# Patient Record
Sex: Female | Born: 1950 | Race: White | Hispanic: No | Marital: Married | Smoking: Former smoker
Health system: Southern US, Community
[De-identification: ages and names within clinical notes are randomized; demographics above are authoritative.]

## PROBLEM LIST (undated history)

## (undated) DIAGNOSIS — I10 Essential (primary) hypertension: Secondary | ICD-10-CM

## (undated) DIAGNOSIS — Z973 Presence of spectacles and contact lenses: Secondary | ICD-10-CM

## (undated) DIAGNOSIS — I499 Cardiac arrhythmia, unspecified: Secondary | ICD-10-CM

## (undated) DIAGNOSIS — T7840XA Allergy, unspecified, initial encounter: Secondary | ICD-10-CM

## (undated) DIAGNOSIS — M549 Dorsalgia, unspecified: Secondary | ICD-10-CM

## (undated) DIAGNOSIS — K219 Gastro-esophageal reflux disease without esophagitis: Secondary | ICD-10-CM

## (undated) DIAGNOSIS — M1990 Unspecified osteoarthritis, unspecified site: Secondary | ICD-10-CM

## (undated) HISTORY — DX: Gastro-esophageal reflux disease without esophagitis: K21.9

## (undated) HISTORY — PX: COLONOSCOPY: SHX174

## (undated) HISTORY — DX: Allergy, unspecified, initial encounter: T78.40XA

## (undated) HISTORY — PX: CHOLECYSTECTOMY: SHX55

## (undated) HISTORY — PX: OTHER SURGICAL HISTORY: SHX169

## (undated) HISTORY — PX: FRACTURE SURGERY: SHX138

---

## 1969-08-21 HISTORY — PX: THYROIDECTOMY, PARTIAL: SHX18

## 1992-08-21 HISTORY — PX: ELBOW SURGERY: SHX618

## 1999-10-06 ENCOUNTER — Other Ambulatory Visit: Admission: RE | Admit: 1999-10-06 | Discharge: 1999-10-06 | Payer: Self-pay | Admitting: Obstetrics and Gynecology

## 1999-10-25 ENCOUNTER — Encounter: Payer: Self-pay | Admitting: Surgery

## 1999-10-27 ENCOUNTER — Encounter (INDEPENDENT_AMBULATORY_CARE_PROVIDER_SITE_OTHER): Payer: Self-pay | Admitting: Specialist

## 1999-10-27 ENCOUNTER — Observation Stay (HOSPITAL_COMMUNITY): Admission: RE | Admit: 1999-10-27 | Discharge: 1999-10-28 | Payer: Self-pay | Admitting: Surgery

## 2002-07-16 ENCOUNTER — Encounter: Payer: Self-pay | Admitting: Internal Medicine

## 2002-07-16 ENCOUNTER — Encounter: Admission: RE | Admit: 2002-07-16 | Discharge: 2002-07-16 | Payer: Self-pay | Admitting: Internal Medicine

## 2002-12-26 ENCOUNTER — Other Ambulatory Visit: Admission: RE | Admit: 2002-12-26 | Discharge: 2002-12-26 | Payer: Self-pay | Admitting: Obstetrics and Gynecology

## 2004-01-22 ENCOUNTER — Other Ambulatory Visit: Admission: RE | Admit: 2004-01-22 | Discharge: 2004-01-22 | Payer: Self-pay | Admitting: Obstetrics and Gynecology

## 2005-03-15 ENCOUNTER — Other Ambulatory Visit: Admission: RE | Admit: 2005-03-15 | Discharge: 2005-03-15 | Payer: Self-pay | Admitting: Obstetrics and Gynecology

## 2005-04-25 ENCOUNTER — Encounter: Admission: RE | Admit: 2005-04-25 | Discharge: 2005-04-25 | Payer: Self-pay | Admitting: *Deleted

## 2006-04-13 ENCOUNTER — Ambulatory Visit: Payer: Self-pay | Admitting: Internal Medicine

## 2006-04-17 ENCOUNTER — Ambulatory Visit: Payer: Self-pay | Admitting: Internal Medicine

## 2006-05-25 ENCOUNTER — Encounter: Admission: RE | Admit: 2006-05-25 | Discharge: 2006-05-25 | Payer: Self-pay | Admitting: Neurology

## 2009-09-10 ENCOUNTER — Ambulatory Visit (HOSPITAL_COMMUNITY): Admission: RE | Admit: 2009-09-10 | Discharge: 2009-09-10 | Payer: Self-pay | Admitting: Cardiology

## 2009-09-14 ENCOUNTER — Ambulatory Visit (HOSPITAL_COMMUNITY): Admission: RE | Admit: 2009-09-14 | Discharge: 2009-09-14 | Payer: Self-pay | Admitting: Cardiology

## 2010-02-03 IMAGING — CR DG CHEST 2V
2 series · 2 of 2 positions shown · non-contrast
Comparison: None

CLINICAL DATA: Arrhythmia, preoperative assessment

CHEST - 2 VIEW

[view not recorded (1 of 2)]
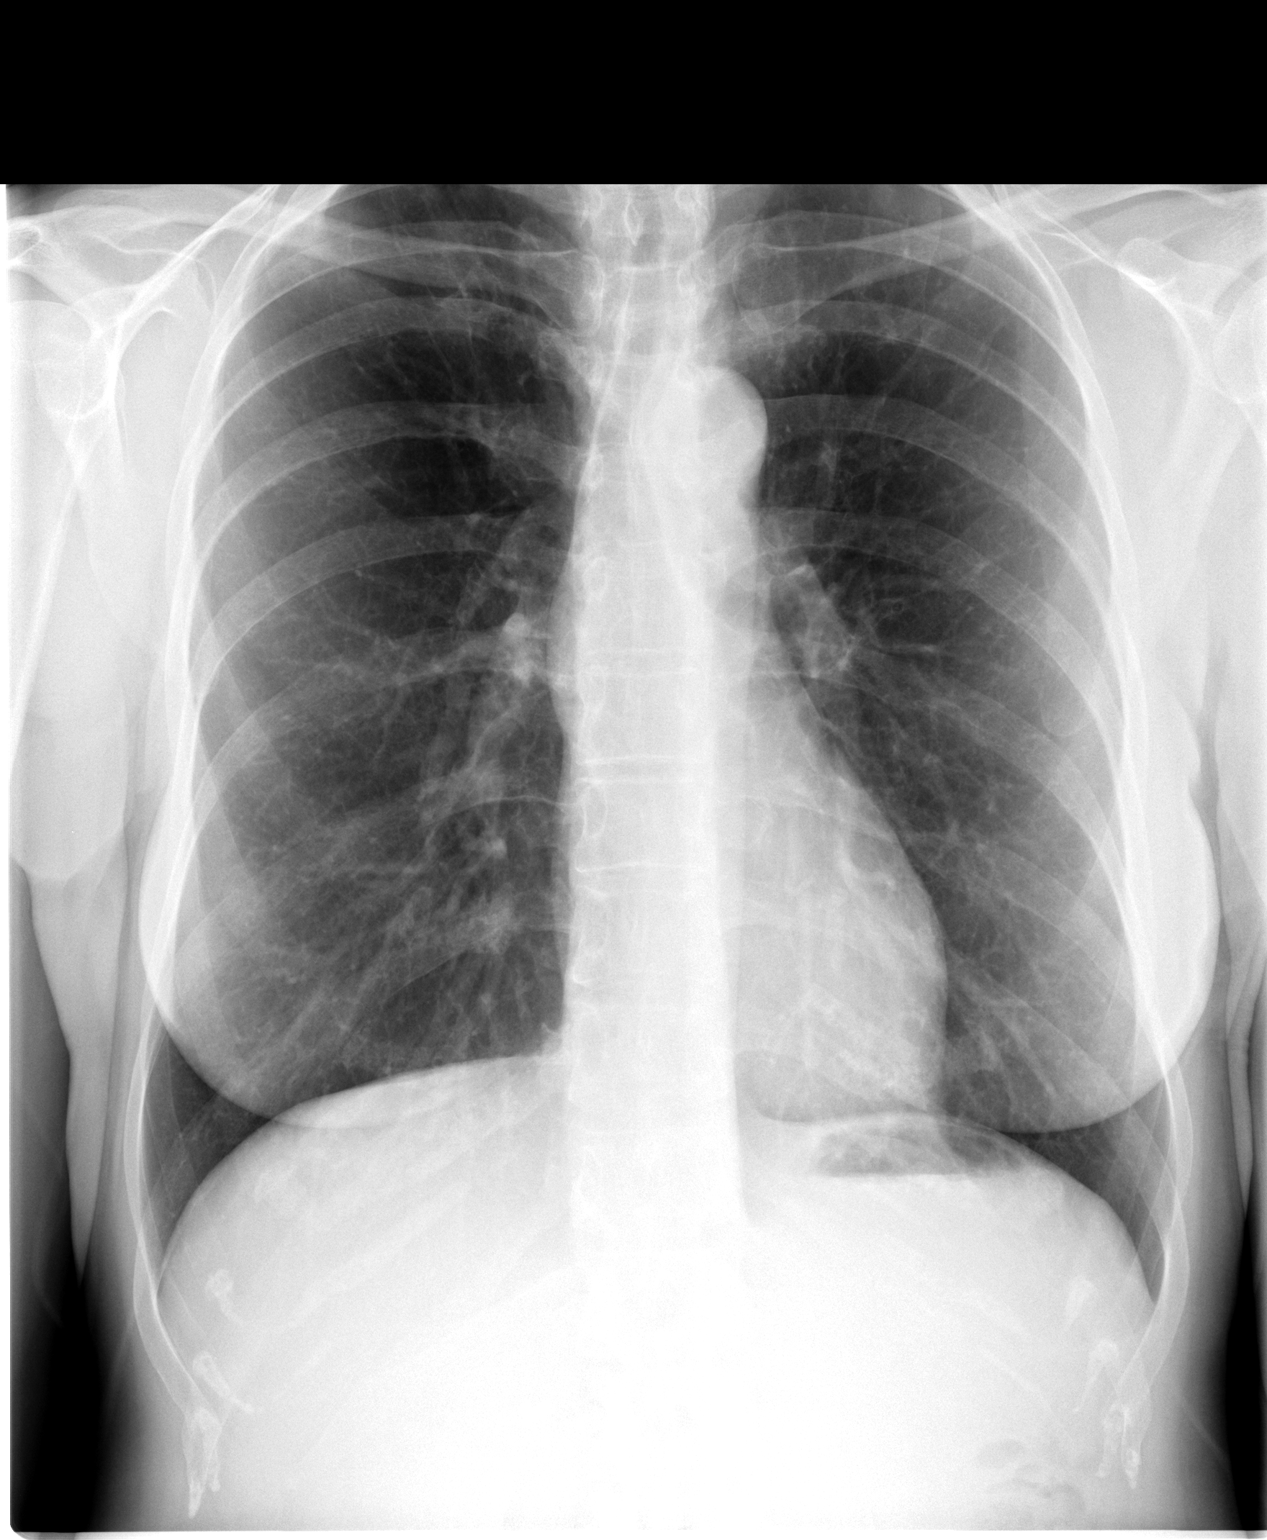

[view not recorded (2 of 2)]
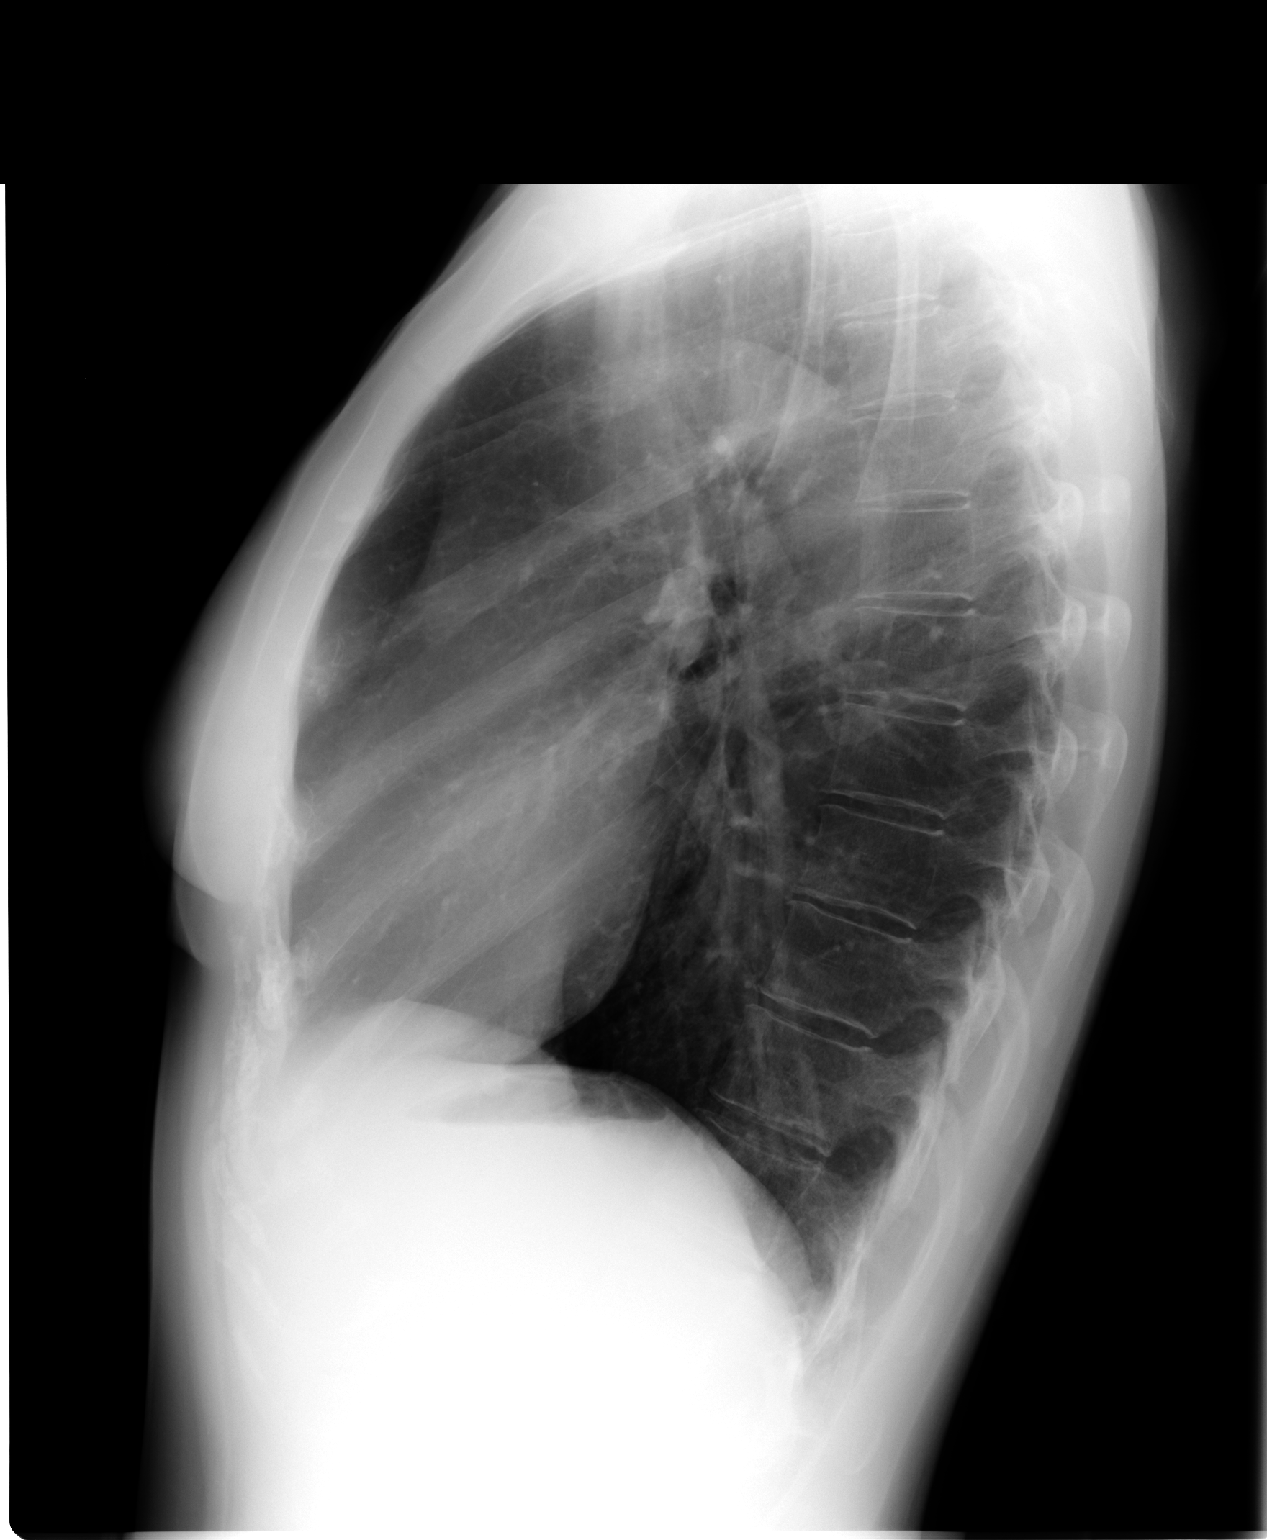

[2 of 2 positions shown; findings below may reference images not displayed]

FINDINGS: Normal heart size, mediastinal contours, pulmonary vascularity.
Emphysematous changes with biapical calcification and scarring.
No acute infiltrate, pleural effusion, or pneumothorax.
No acute bony findings.
IMPRESSION: Emphysematous lungs question COPD with biapical scarring.
No acute abnormalities.

## 2011-01-06 NOTE — Procedures (Signed)
[facility]                            ABDOMINAL ULTRASOUND REPORT   NAME:[name] [name] [name]                        MRN:          [id number]  DATE:04/17/2006                            DOB:          08/11/1951    ACCESSION NUMBER:  [id number].   READING PHYSICIAN:  Hedwig Morton. Juanda Chance, MD   INDICATIONS FOR PROCEDURE:  Abdominal pain.   PROCEDURE:  Multiplanar abdominal ultrasound imaging was performed in the  upright, supine, right and left lateral decubitus positions.   RESULTS:  ABDOMINAL AORTA:  Abdominal aorta is 25 mm.  The abdominal aorta  appears normal.  The IVC is patent.  PANCREAS:  The pancreas appears normal throughout the head, body and tail  without evidence of ductal dilatation, pancreatic masses or peripancreatic  inflammation.  GALLBLADDER:  Gallbladder is well distended, thin walled, with no  pericholecystic fluid or intraluminal echogenic foci to suggest gallstone  disease. Status post cholecystectomy.  COMMON BILE DUCT:  The common bile duct measures 5.7 mm in maximal diameter  and appears normal without evidence of intraluminal foci.  LIVER:  The liver appears normal without evidence of parenchymal lesion,  ductal dilatation or vascular abnormality.  KIDNEYS:  Kidneys measure 105 mm right, 70 mm left.  Both kidneys are normal  in appearance.  SPLEEN:  The spleen is normal in size, measuring 100 mm without parenchymal  lesion.   IMPRESSION:  Normal post cholecystectomy ultrasound of the right upper  quadrant.                                   Hedwig Morton. Juanda Chance, MD   DMB/MedQ  DD:  04/24/2006  DT:  04/25/2006  Job #:  [id number]

## 2011-01-06 NOTE — Assessment & Plan Note (Signed)
[facility]                           GASTROENTEROLOGY OFFICE NOTE   [name] [name]                        MRN:          [id number]  DATE:04/13/2006                            DOB:          1950-09-24    HISTORY OF PRESENT ILLNESS:  [name] [name] is a 60 year old white female. She is a  Runner, broadcasting/film/video at [employer]. We have seen her in the past for  colorectal screening and also for gastroesophageal reflux and epigastric  pain. She had an episode of severe abdominal pain, nausea, vomiting, and  diarrhea in July, which lasted for about a week. It radiated to the scapula  and to the back. [name] [name] had a biliary dysfunction in 2000 and underwent  cholecystectomy. Based on abnormal HIDA scan, she did not have  cholelithiasis. Upper endoscopy in 1993 showed gastritis. Her CLO test was  negative. There was no fever or jaundice. She was evaluated by Dr. Dimas Aguas  who evaluated the patient with a blood test and found that she has mildly  elevated serum amylase of 142, normal 25 to 125. Her liver function studies  were normal and her white blood cell count was normal at 5,200. [name] [name] has  been chronically on Coumadin because of chronic atrial fibrillation. She is  followed by Dr. Lenise Herald. She has actually remained in sinus rhythm  for the past 6 months.   MEDICATIONS:  1. Coumadin alternating 5 and 2.5 mg.  2. Rythmol SR 225 mg b.i.d.  3. Flonase 1 spray daily.  4. Provera 2.5 mg daily.  5. Calcium 600 mg b.i.d.  6. Vitamin D 400 mg b.i.d.  7. Centrum Silver.  8. Fish Oil.  9. Fiber caplets.  10.Prilosec 20 mg b.i.d.  11.Zantac 150 mg p.o. daily.  12.Estratest.   PHYSICAL EXAMINATION:  VITAL SIGNS:  Blood pressure 114/80, pulse 48, weight  146 pounds.  GENERAL:  She was in no distress.  LUNGS:  Clear to auscultation.  HEART:  Normal S1 and S2.  ABDOMEN:  Soft. Minimally tender in epigastrium and right lower quadrant  along the right costal  margin. Lower abdomen was normal.  RECTAL:  Examination shows excess Hemoccult negative stool.   IMPRESSION:  2. A 60 year old white female with acute episode of abdominal pain,      possible biliary in origin, rule out sphincter of Oddi dysfunction,      rule out retained common bile stone, rule out low grade pancreatitis.      History of erosive gastritis.  2. Chronic constipation.   PLAN:  1. Repeat amylase, lipase, and liver function tests.  2. Continue Proton pump inhibitor.  3. Schedule for abdominal ultrasound. Depending on the results of the      blood tests and the ultrasound, she may need ERCP versus upper      endoscopy.                                   [name] [name]. [name] Chance, MD   DMB/MedQ  DD:  04/13/2006  DT:  04/14/2006  Job #:  [id number]   cc:   [name] [name]

## 2011-10-05 ENCOUNTER — Encounter: Payer: Self-pay | Admitting: Internal Medicine

## 2012-05-14 ENCOUNTER — Other Ambulatory Visit: Payer: Self-pay | Admitting: Orthopedic Surgery

## 2012-05-17 ENCOUNTER — Encounter (HOSPITAL_BASED_OUTPATIENT_CLINIC_OR_DEPARTMENT_OTHER): Payer: Self-pay | Admitting: *Deleted

## 2012-05-17 NOTE — Progress Notes (Signed)
Dr singer cleared cardiac notes for pt to have surgery

## 2012-05-17 NOTE — Progress Notes (Signed)
Pt lives in [facility], will come early Tuesday for 1stat.  Has hx of at fib and svt, no occurrence in 2 and half years on no meds.  Called for ekg done in las few weeks at medical doctor.  Pt has new pt visit with cardiologist oct 18 in [place].  Called [place] cards for last note and ekg.

## 2012-05-20 NOTE — H&P (Signed)
  [name] [name] is an 61 y.o. female.   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 She also has a history of bilateral wrist volar myxoid cysts, left greater than right. She has a strong family history with her mother experiencing similar volar cysts in her later life.   [name] has had bilateral hand numbness, night greater than day, aggravated by her active lifestyle which includes golf, tennis, and yoga. She has a history of chronic atrial fibrillation and is being worked up for her atrial fibrillation at this time. She has undergone several injections which have provided her with only temporary relief. She wishes to proceed with surgery at this time.  Past Medical History  Diagnosis Date  . Hypertension   . Dysrhythmia     at fib  svt  no med and no prolbems for 2 and  half years      Past Surgical History  Procedure Date  . Fracture surgery     3 surgeries on foot as child  . Cholecystectomy     2000  . Loop inplant and removed for at fib and svt     No family history on file. Social History:  reports that she has never smoked. She does not have any smokeless tobacco history on file. She reports that she does not use illicit drugs. Her alcohol history not on file.  Allergies: Not on File  No prescriptions prior to admission    No results found for this or any previous visit (from the past 48 hour(s)).  No results found.   Pertinent items are noted in HPI.  There were no vitals taken for this visit.  General appearance: alert Head: Normocephalic, without obvious abnormality Neck: supple, symmetrical, trachea midline Resp: clear to auscultation bilaterally Cardio: regular rate and rhythm GI: WNL Extremities:Inspection of her hands reveals multi-lobular volar ganglions adjacent to the flexor carpi  radialis tendon left more prominent than the right. She has full ROM of her wrist and fingers. She has a positive wrist flexion test at 1 minute on the left. She has a positive Tinel's sign on the right, negative on the left. She has no sign of STS of her fingers, thumb or first dorsal compartment. Her pulses and capillary refill are intact. There is no muscle atrophy.  Plain x-rays of her wrist demonstrate bilateral STT arthrosis and marginal osteophyte formation Patient was seen by Dr. Johna Roles for bilateral electrodiagnostic studies of her median and ulnar nerves.  She has significant bilateral carpal tunnel syndrome, left slightly worse than right  Pulses: 2+ and symmetric Skin: normal Neurologic: Grossly normal    Assessment/Plan Impression:Right CTS  Plan:To the OR for Right CTR.The procedure, risks,benefits and post-op course were discussed with the patient at length and they were in agreement with the plan.   DASNOIT,[name] [name] 05/20/2012, 1:29 PM     H&P documentation: 05/21/2012  -History and Physical Reviewed  -Patient has been re-examined  -No change in the plan of care  [name] Forster, MD

## 2012-05-21 ENCOUNTER — Encounter (HOSPITAL_BASED_OUTPATIENT_CLINIC_OR_DEPARTMENT_OTHER)
Admission: RE | Disposition: A | Discharge status: Home / Self Care | Payer: Self-pay | Source: Ambulatory Visit | Attending: Orthopedic Surgery

## 2012-05-21 ENCOUNTER — Encounter (HOSPITAL_BASED_OUTPATIENT_CLINIC_OR_DEPARTMENT_OTHER): Payer: Self-pay | Admitting: Certified Registered"

## 2012-05-21 ENCOUNTER — Ambulatory Visit (HOSPITAL_BASED_OUTPATIENT_CLINIC_OR_DEPARTMENT_OTHER): Payer: Managed Care, Other (non HMO) | Admitting: Certified Registered"

## 2012-05-21 ENCOUNTER — Ambulatory Visit (HOSPITAL_BASED_OUTPATIENT_CLINIC_OR_DEPARTMENT_OTHER)
Admission: RE | Admit: 2012-05-21 | Discharge: 2012-05-21 | Disposition: A | Discharge status: Home / Self Care | Payer: Managed Care, Other (non HMO) | Source: Ambulatory Visit | Attending: Orthopedic Surgery | Admitting: Orthopedic Surgery

## 2012-05-21 ENCOUNTER — Encounter (HOSPITAL_BASED_OUTPATIENT_CLINIC_OR_DEPARTMENT_OTHER): Payer: Self-pay

## 2012-05-21 DIAGNOSIS — G5600 Carpal tunnel syndrome, unspecified upper limb: Secondary | ICD-10-CM | POA: Insufficient documentation

## 2012-05-21 DIAGNOSIS — I10 Essential (primary) hypertension: Secondary | ICD-10-CM | POA: Insufficient documentation

## 2012-05-21 HISTORY — DX: Cardiac arrhythmia, unspecified: I49.9

## 2012-05-21 HISTORY — PX: CARPAL TUNNEL RELEASE: SHX101

## 2012-05-21 HISTORY — DX: Essential (primary) hypertension: I10

## 2012-05-21 LAB — POCT I-STAT, CHEM 8
Calcium, Ion: 1.18 mmol/L (ref 1.13–1.30)
Creatinine, Ser: 1.1 mg/dL (ref 0.50–1.10)
Glucose, Bld: 87 mg/dL (ref 70–99)
HCT: 40 % (ref 36.0–46.0)
Hemoglobin: 13.6 g/dL (ref 12.0–15.0)
Potassium: 3.4 mEq/L — ABNORMAL LOW (ref 3.5–5.1)
TCO2: 24 mmol/L (ref 0–100)

## 2012-05-21 SURGERY — CARPAL TUNNEL RELEASE
Anesthesia: General | Site: Wrist | Laterality: Right | Wound class: Clean

## 2012-05-21 MED ORDER — MIDAZOLAM HCL 5 MG/5ML IJ SOLN
INTRAMUSCULAR | Status: DC | PRN
  Administered 2012-05-21: 2 mg via INTRAVENOUS

## 2012-05-21 MED ORDER — OXYCODONE HCL 5 MG/5ML PO SOLN: 5.0000 mg | Freq: Once | ORAL | Status: DC | PRN

## 2012-05-21 MED ORDER — HYDROMORPHONE HCL PF 1 MG/ML IJ SOLN
0.2500 mg | INTRAMUSCULAR | Status: DC | PRN
  Administered 2012-05-21: 0.5 mg via INTRAVENOUS

## 2012-05-21 MED ORDER — CHLORHEXIDINE GLUCONATE 4 % EX LIQD: 60.0000 mL | Freq: Once | CUTANEOUS | Status: DC

## 2012-05-21 MED ORDER — OXYCODONE HCL 5 MG PO TABS: 5.0000 mg | ORAL_TABLET | Freq: Once | ORAL | Status: DC | PRN

## 2012-05-21 MED ORDER — MEPERIDINE HCL 25 MG/ML IJ SOLN: 6.2500 mg | INTRAMUSCULAR | Status: DC | PRN

## 2012-05-21 MED ORDER — ONDANSETRON HCL 4 MG/2ML IJ SOLN
INTRAMUSCULAR | Status: DC | PRN
  Administered 2012-05-21: 4 mg via INTRAVENOUS

## 2012-05-21 MED ORDER — DEXAMETHASONE SODIUM PHOSPHATE 4 MG/ML IJ SOLN
INTRAMUSCULAR | Status: DC | PRN
  Administered 2012-05-21: 8 mg via INTRAVENOUS

## 2012-05-21 MED ORDER — LIDOCAINE HCL 2 % IJ SOLN
INTRAMUSCULAR | Status: DC | PRN
  Administered 2012-05-21: 4 mL

## 2012-05-21 MED ORDER — ONDANSETRON HCL 4 MG/2ML IJ SOLN: 4.0000 mg | Freq: Once | INTRAMUSCULAR | Status: DC | PRN

## 2012-05-21 MED ORDER — HYDROCODONE-ACETAMINOPHEN 5-325 MG PO TABS: ORAL_TABLET | ORAL | Status: DC

## 2012-05-21 MED ORDER — LIDOCAINE HCL (CARDIAC) 20 MG/ML IV SOLN
INTRAVENOUS | Status: DC | PRN
  Administered 2012-05-21: 100 mg via INTRAVENOUS

## 2012-05-21 MED ORDER — LACTATED RINGERS IV SOLN
INTRAVENOUS | Status: DC
  Administered 2012-05-21: 07:00:00 via INTRAVENOUS

## 2012-05-21 MED ORDER — PROPOFOL 10 MG/ML IV BOLUS
INTRAVENOUS | Status: DC | PRN
  Administered 2012-05-21: 150 mg via INTRAVENOUS

## 2012-05-21 MED ORDER — FENTANYL CITRATE 0.05 MG/ML IJ SOLN
INTRAMUSCULAR | Status: DC | PRN
  Administered 2012-05-21: 100 ug via INTRAVENOUS

## 2012-05-21 SURGICAL SUPPLY — 37 items
BANDAGE ADHESIVE 1X3 (GAUZE/BANDAGES/DRESSINGS) IMPLANT
BANDAGE ELASTIC 3 VELCRO ST LF (GAUZE/BANDAGES/DRESSINGS) ×2 IMPLANT
BLADE SURG 15 STRL LF DISP TIS (BLADE) ×1 IMPLANT
BLADE SURG 15 STRL SS (BLADE) ×2
BNDG CMPR 9X4 STRL LF SNTH (GAUZE/BANDAGES/DRESSINGS) ×1
BNDG ESMARK 4X9 LF (GAUZE/BANDAGES/DRESSINGS) ×1 IMPLANT
BRUSH SCRUB EZ PLAIN DRY (MISCELLANEOUS) ×2 IMPLANT
CLOTH BEACON ORANGE TIMEOUT ST (SAFETY) ×2 IMPLANT
CORDS BIPOLAR (ELECTRODE) IMPLANT
COVER MAYO STAND STRL (DRAPES) ×2 IMPLANT
COVER TABLE BACK 60X90 (DRAPES) ×2 IMPLANT
CUFF TOURNIQUET SINGLE 18IN (TOURNIQUET CUFF) ×1 IMPLANT
DECANTER SPIKE VIAL GLASS SM (MISCELLANEOUS) IMPLANT
DRAPE EXTREMITY T 121X128X90 (DRAPE) ×2 IMPLANT
DRAPE SURG 17X23 STRL (DRAPES) ×2 IMPLANT
GLOVE BIO SURGEON STRL SZ7 (GLOVE) ×1 IMPLANT
GLOVE BIOGEL M STRL SZ7.5 (GLOVE) ×2 IMPLANT
GLOVE ORTHO TXT STRL SZ7.5 (GLOVE) ×2 IMPLANT
GOWN PREVENTION PLUS XLARGE (GOWN DISPOSABLE) ×2 IMPLANT
GOWN PREVENTION PLUS XXLARGE (GOWN DISPOSABLE) ×4 IMPLANT
NEEDLE 27GAX1X1/2 (NEEDLE) ×1 IMPLANT
PACK BASIN DAY SURGERY FS (CUSTOM PROCEDURE TRAY) ×2 IMPLANT
PAD CAST 3X4 CTTN HI CHSV (CAST SUPPLIES) ×1 IMPLANT
PADDING CAST ABS 4INX4YD NS (CAST SUPPLIES) ×1
PADDING CAST ABS COTTON 4X4 ST (CAST SUPPLIES) ×1 IMPLANT
PADDING CAST COTTON 3X4 STRL (CAST SUPPLIES) ×2
SPLINT PLASTER CAST XFAST 3X15 (CAST SUPPLIES) ×5 IMPLANT
SPLINT PLASTER XTRA FASTSET 3X (CAST SUPPLIES) ×5
SPONGE GAUZE 4X4 12PLY (GAUZE/BANDAGES/DRESSINGS) ×2 IMPLANT
STOCKINETTE 4X48 STRL (DRAPES) ×2 IMPLANT
STRIP CLOSURE SKIN 1/2X4 (GAUZE/BANDAGES/DRESSINGS) ×2 IMPLANT
SUT PROLENE 3 0 PS 2 (SUTURE) ×2 IMPLANT
SYR 3ML 23GX1 SAFETY (SYRINGE) IMPLANT
SYR CONTROL 10ML LL (SYRINGE) ×1 IMPLANT
TRAY DSU PREP LF (CUSTOM PROCEDURE TRAY) ×2 IMPLANT
UNDERPAD 30X30 INCONTINENT (UNDERPADS AND DIAPERS) ×2 IMPLANT
WATER STERILE IRR 1000ML POUR (IV SOLUTION) ×2 IMPLANT

## 2012-05-21 NOTE — Transfer of Care (Signed)
Immediate Anesthesia Transfer of Care Note  Patient: [name] [name]  Procedure(s) Performed: Procedure(s) (LRB) with comments: CARPAL TUNNEL RELEASE (Right) - CARPAL TUNNEL RELEASE  Patient Location: PACU  Anesthesia Type: General  Level of Consciousness: sedated and unresponsive  Airway & Oxygen Therapy: Patient Spontanous Breathing and Patient connected to face mask oxygen  Post-op Assessment: Report given to PACU RN and Post -op Vital signs reviewed and stable  Post vital signs: Reviewed and stable  Complications: No apparent anesthesia complications

## 2012-05-21 NOTE — Anesthesia Preprocedure Evaluation (Signed)
Anesthesia Evaluation  Patient identified by MRN, date of birth, ID band Patient awake    Reviewed: Allergy & Precautions, H&P , NPO status , Patient's Chart, lab work & pertinent test results  Airway Mallampati: I TM Distance: >3 FB Neck ROM: Full    Dental   Pulmonary          Cardiovascular hypertension, + dysrhythmias Atrial Fibrillation     Neuro/Psych    GI/Hepatic   Endo/Other    Renal/GU      Musculoskeletal   Abdominal   Peds  Hematology   Anesthesia Other Findings   Reproductive/Obstetrics                           Anesthesia Physical Anesthesia Plan  ASA: III  Anesthesia Plan: General   Post-op Pain Management:    Induction: Intravenous  Airway Management Planned: LMA  Additional Equipment:   Intra-op Plan:   Post-operative Plan: Extubation in OR  Informed Consent: I have reviewed the patients History and Physical, chart, labs and discussed the procedure including the risks, benefits and alternatives for the proposed anesthesia with the patient or authorized representative who has indicated his/her understanding and acceptance.     Plan Discussed with: CRNA and Surgeon  Anesthesia Plan Comments:         Anesthesia Quick Evaluation

## 2012-05-21 NOTE — Op Note (Signed)
[id number]  

## 2012-05-21 NOTE — Anesthesia Postprocedure Evaluation (Signed)
Anesthesia Post Note  Patient: [name] [name]  Procedure(s) Performed: Procedure(s) (LRB): CARPAL TUNNEL RELEASE (Right)  Anesthesia type: general  Patient location: PACU  Post pain: Pain level controlled  Post assessment: Patient's Cardiovascular Status Stable  Last Vitals:  Filed Vitals:   05/21/12 0816  BP:   Pulse: 54  Temp:   Resp: 9    Post vital signs: Reviewed and stable  Level of consciousness: sedated  Complications: No apparent anesthesia complications

## 2012-05-21 NOTE — Brief Op Note (Signed)
05/21/2012  8:05 AM  PATIENT:  [name] [name]  61 y.o. female  PRE-OPERATIVE DIAGNOSIS:  RIGHT CARPAL TUNNEL SYNDROME  POST-OPERATIVE DIAGNOSIS:  RIGHT CARPAL TUNNEL SYNDROME  PROCEDURE:  Procedure(s) (LRB) with comments: CARPAL TUNNEL RELEASE (Right) - CARPAL TUNNEL RELEASE  SURGEON:  Surgeon(s) and Role:    * Wyn Forster., MD - Primary  PHYSICIAN ASSISTANT:   ASSISTANTS:Aiana Nordquist Dasnoit,P.A-C    ANESTHESIA:   general  EBL:  Total I/O In: 200 [I.V.:200] Out: -   BLOOD ADMINISTERED:none  DRAINS: none   LOCAL MEDICATIONS USED:  XYLOCAINE   SPECIMEN:  No Specimen  DISPOSITION OF SPECIMEN:  N/A  COUNTS:  YES  TOURNIQUET:  * Missing tourniquet times found for documented tourniquets in log:  [id number] *  DICTATION: .Other Dictation: Dictation Number [id number]  PLAN OF CARE: Discharge to home after PACU  PATIENT DISPOSITION:  PACU - hemodynamically stable.

## 2012-05-22 ENCOUNTER — Encounter (HOSPITAL_BASED_OUTPATIENT_CLINIC_OR_DEPARTMENT_OTHER): Payer: Self-pay | Admitting: Orthopedic Surgery

## 2012-05-22 NOTE — Op Note (Signed)
NAMEALLISA, [name]                 ACCOUNT NO.:  [id number]  MEDICAL RECORD NO.:  [id number]  LOCATION:                                 FACILITY:  PHYSICIAN:  Katy Fitch. Sai Moura, M.D.      DATE OF BIRTH:  DATE OF PROCEDURE:  05/21/2012 DATE OF DISCHARGE:                              OPERATIVE REPORT   PREOPERATIVE DIAGNOSIS:  Entrapment neuropathy, median nerve, right carpal tunnel.  POSTOPERATIVE DIAGNOSIS:  Entrapment neuropathy, median nerve, right carpal tunnel.  OPERATION:  Release of right transverse carpal ligament.  OPERATING SURGEON:  Katy Fitch. Maila Dukes, MD  ASSISTANT:  Marveen Reeks Dasnoit, PA-C  ANESTHESIA:  General by LMA.  SUPERVISING ANESTHESIOLOGIST:  Kaylyn Layer. 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 She has a history of bilateral hand numbness.  She presented for evaluation, and was noted to have evidence of clinical carpal tunnel syndrome.  Electrodiagnostic studies confirmed moderately severe carpal tunnel syndrome.  Due to a failure to respond to nonoperative measures, we have recommended proceeding with release of her right transverse carpal ligament at this time.  Preoperatively, questions were invited and answered in detail in the office as well in the holding area.  Her correct surgical site was marked per protocol in the holding area.  She was then transferred to room #6 of the [facility], where under Dr. Deirdre Priest direct supervision, general anesthesia by LMA technique was induced.  The right arm was prepped with Betadine soap and solution, sterilely draped.  A pneumatic tourniquet was applied to the proximal right brachium.  Following exsanguination of the right arm with Esmarch bandage, the arterial tourniquet was inflated to 220 mmHg.  Following routine surgical time-out, procedure commenced with short incision in the line of the ring  finger of the palm.  Subcutaneous tissues were carefully divided revealing the palmar fascia.  The fascia was split longitudinally allowing visualization of the common sensory branch of the median nerve and the superficial palmar arch.  The distal margin of the transverse carpal ligament was identified by sounded with a Penfield 4 elevator followed by use of scissors to release the transverse carpal ligament along its ulnar border extending into the distal forearm.  This widely opened the carpal canal.  No masses or other treatments were noted.  Bleeding points along the margin of the released ligament were electrocauterized with bipolar current followed by repair of the skin with intradermal 3-0 Prolene suture.  A compressive dressings was applied with a volar plaster splint maintaining the wrist in 15 degrees of dorsiflexion.  For aftercare, [name] is provided a prescription for Vicodin 5 mg 1 p.o. q.4 hours p.r.n. pain, 20 tablets without refill.     Katy Fitch Alezandra Egli, M.D.     RVS/MEDQ  D:  05/21/2012  T:  05/21/2012  Job:  [id number]

## 2012-05-27 ENCOUNTER — Encounter: Payer: Self-pay | Admitting: Internal Medicine

## 2012-06-21 ENCOUNTER — Other Ambulatory Visit: Payer: Self-pay | Admitting: Orthopedic Surgery

## 2012-07-16 ENCOUNTER — Other Ambulatory Visit: Payer: Self-pay | Admitting: Orthopedic Surgery

## 2012-07-26 ENCOUNTER — Ambulatory Visit (HOSPITAL_BASED_OUTPATIENT_CLINIC_OR_DEPARTMENT_OTHER)
Admission: RE | Admit: 2012-07-26 | Payer: Managed Care, Other (non HMO) | Source: Ambulatory Visit | Admitting: Orthopedic Surgery

## 2012-07-26 ENCOUNTER — Encounter (HOSPITAL_BASED_OUTPATIENT_CLINIC_OR_DEPARTMENT_OTHER): Admission: RE | Payer: Self-pay | Source: Ambulatory Visit

## 2012-07-26 SURGERY — CARPAL TUNNEL RELEASE
Anesthesia: General | Site: Wrist | Laterality: Left

## 2012-10-05 ENCOUNTER — Other Ambulatory Visit: Payer: Self-pay

## 2013-06-26 ENCOUNTER — Other Ambulatory Visit: Payer: Self-pay

## 2014-02-03 ENCOUNTER — Other Ambulatory Visit: Payer: Self-pay | Admitting: Orthopedic Surgery

## 2014-02-05 ENCOUNTER — Encounter (HOSPITAL_BASED_OUTPATIENT_CLINIC_OR_DEPARTMENT_OTHER): Payer: Self-pay | Admitting: *Deleted

## 2014-02-05 NOTE — Progress Notes (Signed)
Pt here 2013-lives [place]-will need istat-sees cardiology for hx svt-not since 2010

## 2014-02-09 NOTE — H&P (Signed)
[name] [name] is an 63 y.o. female.   Chief Complaint: Left hand numbness and volar myxoid cyst.  HPI: 63 year old woman with left hand numbness and left volar myxoid cyst.  She states that She has decided to live with the cyst.  Past Medical History  Diagnosis Date  . Hypertension   . Dysrhythmia     at fib  svt  no med and no prolbems for 2 and  half years    . Back pain   . Arthritis   . Wears glasses     Past Surgical History  Procedure Laterality Date  . Fracture surgery      3 surgeries on foot as child  . Cholecystectomy      2000  . Loop inplant and removed for at fib and svt    . Carpal tunnel release  05/21/2012    Procedure: CARPAL TUNNEL RELEASE;  Surgeon: Wyn Forsterobert V Sypher Jr., MD;  Location: [facility];  Service: Orthopedics;  Laterality: Right;  CARPAL TUNNEL RELEASE  . Thyroidectomy, partial  1971  . Elbow surgery  1994    left-tennis elbow  . Colonoscopy      History reviewed. No pertinent family history. Social History:  reports that she quit smoking about 30 years ago. She does not have any smokeless tobacco history on file. She reports that she drinks alcohol. She reports that she does not use illicit drugs.  Allergies: No Known Allergies  No prescriptions prior to admission    No results found for this or any previous visit (from the past 48 hour(s)). No results found.  Review of Systems  Constitutional: Negative.   HENT: Negative.   Eyes:       Glasses  Respiratory: Negative.   Cardiovascular:       Hx of high blood pressure  Gastrointestinal:       History of peptic ulcer dz  Genitourinary: Negative.   Musculoskeletal:       Myxoid cyst of left volar wrist. History of positive NCV for left CTS  Skin: Negative.   Neurological: Negative.   Endo/Heme/Allergies: Negative.   Psychiatric/Behavioral: Negative.     Height 5\' 8"  (1.727 m), weight 67.132 kg (148 lb). Physical Exam  Constitutional: She is oriented to person,  place, and time. She appears well-developed and well-nourished.  HENT:  Head: Normocephalic and atraumatic.  Eyes: Conjunctivae and EOM are normal. Pupils are equal, round, and reactive to light.  Neck: Normal range of motion. Neck supple.  Cardiovascular: Normal rate, regular rhythm, normal heart sounds and intact distal pulses.   Respiratory: Effort normal and breath sounds normal.  GI: Soft. Bowel sounds are normal.  Musculoskeletal: Normal range of motion. She exhibits no edema and no tenderness.  Neurological: She is alert and oriented to person, place, and time. She has normal reflexes.  Decreased median sensation of left hand  Skin: Skin is warm and dry.  Psychiatric: She has a normal mood and affect. Her behavior is normal. Judgment and thought content normal.     Assessment/Plan Left carpal tunnel syndrome and volar myxoid cyst.  Release left transverse carpal ligament and discussion of myxoid cyst management.  SYPHER JR,ROBERT V 02/09/2014, 10:46 PM

## 2014-02-10 ENCOUNTER — Encounter (HOSPITAL_BASED_OUTPATIENT_CLINIC_OR_DEPARTMENT_OTHER): Payer: Self-pay

## 2014-02-10 ENCOUNTER — Ambulatory Visit (HOSPITAL_BASED_OUTPATIENT_CLINIC_OR_DEPARTMENT_OTHER): Payer: Managed Care, Other (non HMO) | Admitting: Anesthesiology

## 2014-02-10 ENCOUNTER — Encounter (HOSPITAL_BASED_OUTPATIENT_CLINIC_OR_DEPARTMENT_OTHER)
Admission: RE | Disposition: A | Discharge status: Home / Self Care | Payer: Self-pay | Source: Ambulatory Visit | Attending: Orthopedic Surgery

## 2014-02-10 ENCOUNTER — Encounter (HOSPITAL_BASED_OUTPATIENT_CLINIC_OR_DEPARTMENT_OTHER): Payer: Managed Care, Other (non HMO) | Admitting: Anesthesiology

## 2014-02-10 ENCOUNTER — Ambulatory Visit (HOSPITAL_BASED_OUTPATIENT_CLINIC_OR_DEPARTMENT_OTHER)
Admission: RE | Admit: 2014-02-10 | Discharge: 2014-02-10 | Disposition: A | Discharge status: Home / Self Care | Payer: Managed Care, Other (non HMO) | Source: Ambulatory Visit | Attending: Orthopedic Surgery | Admitting: Orthopedic Surgery

## 2014-02-10 DIAGNOSIS — M129 Arthropathy, unspecified: Secondary | ICD-10-CM | POA: Insufficient documentation

## 2014-02-10 DIAGNOSIS — Z87891 Personal history of nicotine dependence: Secondary | ICD-10-CM | POA: Insufficient documentation

## 2014-02-10 DIAGNOSIS — I10 Essential (primary) hypertension: Secondary | ICD-10-CM | POA: Insufficient documentation

## 2014-02-10 DIAGNOSIS — G5600 Carpal tunnel syndrome, unspecified upper limb: Secondary | ICD-10-CM | POA: Insufficient documentation

## 2014-02-10 HISTORY — DX: Presence of spectacles and contact lenses: Z97.3

## 2014-02-10 HISTORY — DX: Dorsalgia, unspecified: M54.9

## 2014-02-10 HISTORY — DX: Unspecified osteoarthritis, unspecified site: M19.90

## 2014-02-10 HISTORY — PX: CARPAL TUNNEL RELEASE: SHX101

## 2014-02-10 LAB — POCT I-STAT, CHEM 8
BUN: 9 mg/dL (ref 6–23)
CALCIUM ION: 1.17 mmol/L (ref 1.13–1.30)
CREATININE: 1.1 mg/dL (ref 0.50–1.10)
Chloride: 105 mEq/L (ref 96–112)
GLUCOSE: 95 mg/dL (ref 70–99)
HCT: 41 % (ref 36.0–46.0)
HEMOGLOBIN: 13.9 g/dL (ref 12.0–15.0)
Potassium: 4.1 mEq/L (ref 3.7–5.3)
Sodium: 141 mEq/L (ref 137–147)
TCO2: 27 mmol/L (ref 0–100)

## 2014-02-10 SURGERY — CARPAL TUNNEL RELEASE
Anesthesia: General | Site: Wrist | Laterality: Left

## 2014-02-10 MED ORDER — FENTANYL CITRATE 0.05 MG/ML IJ SOLN
50.0000 ug | INTRAMUSCULAR | Status: DC | PRN
Start: 2014-02-10 — End: 2014-02-10

## 2014-02-10 MED ORDER — MIDAZOLAM HCL 5 MG/5ML IJ SOLN
INTRAMUSCULAR | Status: DC | PRN
  Administered 2014-02-10: 2 mg via INTRAVENOUS

## 2014-02-10 MED ORDER — ONDANSETRON HCL 4 MG/2ML IJ SOLN
INTRAMUSCULAR | Status: DC | PRN
  Administered 2014-02-10: 4 mg via INTRAVENOUS

## 2014-02-10 MED ORDER — PROPOFOL 10 MG/ML IV BOLUS
INTRAVENOUS | Status: DC | PRN
  Administered 2014-02-10: 130 mg via INTRAVENOUS

## 2014-02-10 MED ORDER — HYDROMORPHONE HCL PF 1 MG/ML IJ SOLN
INTRAMUSCULAR | Status: AC
  Filled 2014-02-10: qty 1

## 2014-02-10 MED ORDER — OXYCODONE HCL 5 MG/5ML PO SOLN: 5.0000 mg | Freq: Once | ORAL | Status: DC | PRN

## 2014-02-10 MED ORDER — EPHEDRINE SULFATE 50 MG/ML IJ SOLN
INTRAMUSCULAR | Status: DC | PRN
  Administered 2014-02-10: 10 mg via INTRAVENOUS

## 2014-02-10 MED ORDER — LIDOCAINE HCL 2 % IJ SOLN
INTRAMUSCULAR | Status: DC | PRN
  Administered 2014-02-10: 2 mL

## 2014-02-10 MED ORDER — DEXAMETHASONE SODIUM PHOSPHATE 10 MG/ML IJ SOLN
INTRAMUSCULAR | Status: DC | PRN
  Administered 2014-02-10: 10 mg via INTRAVENOUS

## 2014-02-10 MED ORDER — LIDOCAINE HCL (CARDIAC) 20 MG/ML IV SOLN
INTRAVENOUS | Status: DC | PRN
  Administered 2014-02-10: 60 mg via INTRAVENOUS

## 2014-02-10 MED ORDER — LACTATED RINGERS IV SOLN
INTRAVENOUS | Status: DC
  Administered 2014-02-10 (×2): via INTRAVENOUS

## 2014-02-10 MED ORDER — MIDAZOLAM HCL 2 MG/2ML IJ SOLN: 1.0000 mg | INTRAMUSCULAR | Status: DC | PRN

## 2014-02-10 MED ORDER — FENTANYL CITRATE 0.05 MG/ML IJ SOLN
INTRAMUSCULAR | Status: DC | PRN
  Administered 2014-02-10: 100 ug via INTRAVENOUS

## 2014-02-10 MED ORDER — HYDROMORPHONE HCL PF 1 MG/ML IJ SOLN
0.2500 mg | INTRAMUSCULAR | Status: DC | PRN
  Administered 2014-02-10: 0.5 mg via INTRAVENOUS
  Administered 2014-02-10: 0.25 mg via INTRAVENOUS

## 2014-02-10 MED ORDER — FENTANYL CITRATE 0.05 MG/ML IJ SOLN
INTRAMUSCULAR | Status: AC
  Filled 2014-02-10: qty 4

## 2014-02-10 MED ORDER — OXYCODONE HCL 5 MG PO TABS: 5.0000 mg | ORAL_TABLET | Freq: Once | ORAL | Status: DC | PRN

## 2014-02-10 MED ORDER — MIDAZOLAM HCL 2 MG/2ML IJ SOLN
INTRAMUSCULAR | Status: AC
  Filled 2014-02-10: qty 2

## 2014-02-10 MED ORDER — OXYCODONE-ACETAMINOPHEN 5-325 MG PO TABS: ORAL_TABLET | ORAL | Status: DC

## 2014-02-10 MED ORDER — PROPOFOL 10 MG/ML IV BOLUS
INTRAVENOUS | Status: AC
  Filled 2014-02-10: qty 20

## 2014-02-10 MED ORDER — CHLORHEXIDINE GLUCONATE 4 % EX LIQD
60.0000 mL | Freq: Once | CUTANEOUS | Status: DC
Start: 2014-02-10 — End: 2014-02-10

## 2014-02-10 SURGICAL SUPPLY — 42 items
BANDAGE ADH SHEER 1  50/CT (GAUZE/BANDAGES/DRESSINGS) IMPLANT
BANDAGE COBAN STERILE 2 (GAUZE/BANDAGES/DRESSINGS) IMPLANT
BANDAGE ELASTIC 3 VELCRO ST LF (GAUZE/BANDAGES/DRESSINGS) ×1 IMPLANT
BLADE SURG 15 STRL LF DISP TIS (BLADE) ×1 IMPLANT
BLADE SURG 15 STRL SS (BLADE) ×3
BNDG CMPR 9X4 STRL LF SNTH (GAUZE/BANDAGES/DRESSINGS) ×1
BNDG COHESIVE 3X5 TAN STRL LF (GAUZE/BANDAGES/DRESSINGS) ×2 IMPLANT
BNDG ESMARK 4X9 LF (GAUZE/BANDAGES/DRESSINGS) ×2 IMPLANT
BRUSH SCRUB EZ PLAIN DRY (MISCELLANEOUS) ×3 IMPLANT
CLOSURE WOUND 1/2 X4 (GAUZE/BANDAGES/DRESSINGS) ×1
CORDS BIPOLAR (ELECTRODE) ×2 IMPLANT
COVER MAYO STAND STRL (DRAPES) ×3 IMPLANT
COVER TABLE BACK 60X90 (DRAPES) ×3 IMPLANT
CUFF TOURNIQUET SINGLE 18IN (TOURNIQUET CUFF) ×2 IMPLANT
DECANTER SPIKE VIAL GLASS SM (MISCELLANEOUS) IMPLANT
DRAPE EXTREMITY T 121X128X90 (DRAPE) ×3 IMPLANT
DRAPE SURG 17X23 STRL (DRAPES) ×3 IMPLANT
GAUZE SPONGE 4X4 12PLY STRL (GAUZE/BANDAGES/DRESSINGS) ×3 IMPLANT
GLOVE BIO SURGEON STRL SZ7 (GLOVE) ×2 IMPLANT
GLOVE BIO SURGEON STRL SZ7.5 (GLOVE) ×2 IMPLANT
GLOVE BIOGEL M STRL SZ7.5 (GLOVE) ×1 IMPLANT
GLOVE ORTHO TXT STRL SZ7.5 (GLOVE) ×3 IMPLANT
GOWN STRL REUS W/ TWL LRG LVL3 (GOWN DISPOSABLE) ×1 IMPLANT
GOWN STRL REUS W/ TWL XL LVL3 (GOWN DISPOSABLE) ×2 IMPLANT
GOWN STRL REUS W/TWL LRG LVL3 (GOWN DISPOSABLE) ×3
GOWN STRL REUS W/TWL XL LVL3 (GOWN DISPOSABLE) ×6
NEEDLE 27GAX1X1/2 (NEEDLE) ×2 IMPLANT
PACK BASIN DAY SURGERY FS (CUSTOM PROCEDURE TRAY) ×3 IMPLANT
PAD CAST 3X4 CTTN HI CHSV (CAST SUPPLIES) ×1 IMPLANT
PADDING CAST ABS 4INX4YD NS (CAST SUPPLIES) ×2
PADDING CAST ABS COTTON 4X4 ST (CAST SUPPLIES) ×1 IMPLANT
PADDING CAST COTTON 3X4 STRL (CAST SUPPLIES) ×3
SPLINT PLASTER CAST XFAST 3X15 (CAST SUPPLIES) ×5 IMPLANT
SPLINT PLASTER XTRA FASTSET 3X (CAST SUPPLIES) ×10
STOCKINETTE 4X48 STRL (DRAPES) ×3 IMPLANT
STRIP CLOSURE SKIN 1/2X4 (GAUZE/BANDAGES/DRESSINGS) ×2 IMPLANT
SUT PROLENE 3 0 PS 2 (SUTURE) ×3 IMPLANT
SYR 3ML 23GX1 SAFETY (SYRINGE) IMPLANT
SYR CONTROL 10ML LL (SYRINGE) ×2 IMPLANT
TOWEL OR 17X24 6PK STRL BLUE (TOWEL DISPOSABLE) ×3 IMPLANT
TRAY DSU PREP LF (CUSTOM PROCEDURE TRAY) ×3 IMPLANT
UNDERPAD 30X30 INCONTINENT (UNDERPADS AND DIAPERS) ×3 IMPLANT

## 2014-02-10 NOTE — Anesthesia Postprocedure Evaluation (Signed)
  Anesthesia Post-op Note  Patient: [name] [name]  Procedure(s) Performed: Procedure(s): LEFT CARPAL TUNNEL RELEASE (Left)  Patient Location: PACU  Anesthesia Type:General  Level of Consciousness: awake and alert   Airway and Oxygen Therapy: Patient Spontanous Breathing  Post-op Pain: mild  Post-op Assessment: Post-op Vital signs reviewed, Patient's Cardiovascular Status Stable and Respiratory Function Stable  Post-op Vital Signs: Reviewed  Filed Vitals:   02/10/14 1000  BP:   Pulse: 52  Temp:   Resp: 24    Complications: No apparent anesthesia complications

## 2014-02-10 NOTE — Anesthesia Preprocedure Evaluation (Signed)
Anesthesia Evaluation  Patient identified by MRN, date of birth, ID band Patient awake    Reviewed: Allergy & Precautions, H&P , NPO status , Patient's Chart, lab work & pertinent test results  Airway Mallampati: II TM Distance: >3 FB Neck ROM: Full    Dental no notable dental hx. (+) Teeth Intact, Dental Advisory Given   Pulmonary neg pulmonary ROS, former smoker,  breath sounds clear to auscultation  Pulmonary exam normal       Cardiovascular hypertension, negative cardio ROS  + dysrhythmias Supra Ventricular Tachycardia Rhythm:Regular Rate:Normal     Neuro/Psych negative neurological ROS  negative psych ROS   GI/Hepatic negative GI ROS, Neg liver ROS,   Endo/Other  negative endocrine ROS  Renal/GU negative Renal ROS  negative genitourinary   Musculoskeletal   Abdominal   Peds  Hematology negative hematology ROS (+)   Anesthesia Other Findings   Reproductive/Obstetrics negative OB ROS                           Anesthesia Physical Anesthesia Plan  ASA: II  Anesthesia Plan: General   Post-op Pain Management:    Induction: Intravenous  Airway Management Planned: LMA  Additional Equipment:   Intra-op Plan:   Post-operative Plan: Extubation in OR  Informed Consent: I have reviewed the patients History and Physical, chart, labs and discussed the procedure including the risks, benefits and alternatives for the proposed anesthesia with the patient or authorized representative who has indicated his/her understanding and acceptance.   Dental advisory given  Plan Discussed with: CRNA  Anesthesia Plan Comments:         Anesthesia Quick Evaluation

## 2014-02-10 NOTE — Op Note (Signed)
NAMIke [name]:  [name] [name]                 ACCOUNT NO.:  [id number]  MEDICAL RECORD NO.:  [id number]  LOCATION:                                 FACILITY:  PHYSICIAN:  Katy Fitchobert V. Sypher, M.D.      DATE OF BIRTH:  DATE OF PROCEDURE:  02/10/2014 DATE OF DISCHARGE:                              OPERATIVE REPORT   PREOPERATIVE DIAGNOSIS:  Left carpal tunnel syndrome.  POSTOPERATIVE DIAGNOSIS:  Left carpal tunnel syndrome.  OPERATION:  Release of left transverse carpal ligament.  OPERATING SURGEON:  Katy Fitchobert V. Sypher, M.D.  ASSISTANT:  Nurse.  ANESTHESIA:  General by LMA.  SUPERVISING ANESTHESIOLOGIST:  Dr. Sampson GoonFitzgerald.  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  We have previously treated her for right carpal tunnel syndrome.  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 We have had a detailed discussion regarding management of myxoid cysts. After informed consent, [name] [name] has decided to live with the myxoid cysts which have been evaluated [place] at [place], [place] and in [place]. She was recently noted to have significant left carpal tunnel syndrome. 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  We advised her to proceed with release of left transverse carpal ligament.  After informed consent, she was brought to the operating room at this time.  Preoperatively, she was interviewed by Dr. Sampson GoonFitzgerald of Anesthesia. General anesthesia by LMA technique was recommended and accepted.  Her left hand and wrist were marked as a proper surgical site per protocol with a marking pen.  Questions regarding anticipated procedure invited and answered in detail in the holding area.  [name] [name] was then transferred to room to the [facility] and placed in supine position, on the operating table.  DESCRIPTION OF PROCEDURE:   Following the induction of general anesthesia by LMA technique, the left hand and arm were prepped with Betadine soap and solution, sterilely draped.  Following exsanguination of the left hand and arm with Esmarch bandage, the arterial tourniquet on the proximal left brachium was inflated to 220 mmHg.  A routine surgical time-out was accomplished followed by proceeding to make a 2 cm incision in the line of the ring finger in the palm.  Subcutaneous tissues were carefully divided taking care to identify subcutaneous vessels.  These were electrocauterized with bipolar forceps.  The palmar fascia was split longitudinally and an Alms retractor placed.  The distal margin of the transverse carpal ligament was obscured by an atypical muscle.  This was teased apart looking for aberrant motor branches.  The distal margin of the transverse carpal ligament was identified followed by sounding the carpal canal with a Insurance risk [place] 4 elevator.  The transverse carpal ligament was released along its ulnar border extending into the distal forearm.  The volar forearm fascia was released subcutaneously.  The ulnar bursa was thickened and fibrotic.  No mass or other predicaments were noted.  Bleeding points along the margin of the released ligament were electrocauterized with bipolar current.  The wound was inspected for pathology, none was identified other than the  thickened ulnar bursa. The skin was then repaired with intradermal 3-0 Prolene and a Steri- Strips.  2% lidocaine was infiltrated along the wound margins for postoperative comfort.  The spine was then placed in compressive dressing with sterile gauze, sterile Webril, and a volar plaster splint maintaining the wrist at 15 degrees dorsiflexion.  For aftercare, she is provided a prescription for Percocet 5 mg 1-2 tablets p.o. q.4-6 hours p.r.n. pain, 20 tablets without refill.     Katy Fitchobert V. Sypher, M.D.     RVS/MEDQ  D:  02/10/2014  T:   02/10/2014  Job:  [id number]

## 2014-02-10 NOTE — Discharge Instructions (Addendum)
Elevate left hand for 3 days.  Use hand gently. Keep dressing dry. Call our office for fever, dressing difficulties and any unexpected circumstances.   Post Anesthesia Home Care Instructions  Activity: Get plenty of rest for the remainder of the day. A responsible adult should stay with you for 24 hours following the procedure.  For the next 24 hours, DO NOT: -Drive a car -Advertising copywriterperate machinery -Drink alcoholic beverages -Take any medication unless instructed by your physician -Make any legal decisions or sign important papers.  Meals: Start with liquid foods such as gelatin or soup. Progress to regular foods as tolerated. Avoid greasy, spicy, heavy foods. If nausea and/or vomiting occur, drink only clear liquids until the nausea and/or vomiting subsides. Call your physician if vomiting continues.  Special Instructions/Symptoms: Your throat may feel dry or sore from the anesthesia or the breathing tube placed in your throat during surgery. If this causes discomfort, gargle with warm salt water. The discomfort should disappear within 24 hours.

## 2014-02-10 NOTE — Op Note (Signed)
[id number] 

## 2014-02-10 NOTE — Transfer of Care (Signed)
Immediate Anesthesia Transfer of Care Note  Patient: Halina AndreasSusan C Vandevender  Procedure(s) Performed: Procedure(s): LEFT CARPAL TUNNEL RELEASE (Left)  Patient Location: PACU  Anesthesia Type:General  Level of Consciousness: awake, sedated and responds to stimulation  Airway & Oxygen Therapy: Patient Spontanous Breathing and Patient connected to face mask oxygen  Post-op Assessment: Report given to PACU RN and Post -op Vital signs reviewed and stable  Post vital signs: Reviewed and stable  Complications: No apparent anesthesia complications

## 2014-02-10 NOTE — Anesthesia Procedure Notes (Signed)
Procedure Name: LMA Insertion Date/Time: 02/10/2014 8:32 AM Performed by: Burna CashONRAD, JOSEPH C Pre-anesthesia Checklist: Patient identified, Emergency Drugs available, Suction available and Patient being monitored Patient Re-evaluated:Patient Re-evaluated prior to inductionOxygen Delivery Method: Circle System Utilized Preoxygenation: Pre-oxygenation with 100% oxygen Intubation Type: IV induction Ventilation: Mask ventilation without difficulty LMA: LMA inserted LMA Size: 4.0 Number of attempts: 1 Airway Equipment and Method: bite block Placement Confirmation: positive ETCO2 Tube secured with: Tape Dental Injury: Teeth and Oropharynx as per pre-operative assessment

## 2014-02-10 NOTE — Brief Op Note (Signed)
02/10/2014  9:12 AM  PATIENT:  [name] [name]  63 y.o. female  PRE-OPERATIVE DIAGNOSIS:  LEFT CARPAL TUNNEL SYNDROME  POST-OPERATIVE DIAGNOSIS:  LEFT CARPAL TUNNEL SYNDROME  PROCEDURE:  Procedure(s): LEFT CARPAL TUNNEL RELEASE (Left)  SURGEON:  Surgeon(s) and Role:    * Wyn Forsterobert Sypher Jr V, MD - Primary  PHYSICIAN ASSISTANT:   ASSISTANTS: nurse   ANESTHESIA:   general  EBL:  Total I/O In: 1000 [I.V.:1000] Out: -   BLOOD ADMINISTERED:none  DRAINS: none   LOCAL MEDICATIONS USED:  XYLOCAINE   SPECIMEN:  No Specimen  DISPOSITION OF SPECIMEN:  N/A  COUNTS:  YES  TOURNIQUET:   Total Tourniquet Time Documented: Upper Arm (Left) - 13 minutes Total: Upper Arm (Left) - 13 minutes   DICTATION: .Other Dictation: Dictation Number [id number]  PLAN OF CARE: Discharge to home after PACU  PATIENT DISPOSITION:  PACU - hemodynamically stable.   Delay start of Pharmacological VTE agent (>24hrs) due to surgical blood loss or risk of bleeding: not applicable

## 2014-02-11 ENCOUNTER — Encounter (HOSPITAL_BASED_OUTPATIENT_CLINIC_OR_DEPARTMENT_OTHER): Payer: Self-pay | Admitting: Orthopedic Surgery

## 2014-07-21 ENCOUNTER — Encounter: Payer: Self-pay | Admitting: Internal Medicine

## 2014-08-05 ENCOUNTER — Telehealth: Payer: Self-pay | Admitting: *Deleted

## 2014-08-05 ENCOUNTER — Ambulatory Visit (AMBULATORY_SURGERY_CENTER): Payer: Self-pay | Admitting: *Deleted

## 2014-08-05 VITALS — Ht 69.0 in | Wt 155.6 lb

## 2014-08-05 DIAGNOSIS — R194 Change in bowel habit: Secondary | ICD-10-CM

## 2014-08-05 MED ORDER — MOVIPREP 100 G PO SOLR: 1.0000 | Freq: Once | ORAL | Status: DC

## 2014-08-05 NOTE — Telephone Encounter (Signed)
Dr Juanda ChanceBrodie,  We received this pt's last colon and I had April put the records on your desk to review.  Thanks Hilda LiasMarie PV

## 2014-08-05 NOTE — Telephone Encounter (Signed)
Dr Juanda ChanceBrodie.  I saw this nice lady today in [place]  Pt states she had a colonoscopy in 2011 in [place] and the one before this was in 2003 with Dr Juanda ChanceBrodie, both normal. Pt states she has had multiple health issues in the past year. She states her BP was elevated to 200/125 and that is now under control with meds, she has had significant changes in her bowels. One day constipated, one day diarrhea and the size of her stools have changed and are like the size of chalk and this is new. No blood in stools. Stool varies. isnt always the same size. Her mother had a large polyp removed, the size of a tennis ball and had to also have inches of her colon removed with that polyp per pt but was benign. We have sent for her 2011 colon to be faxed to us at fax number [phone]. Pt states that with these changes she has started taking miralax daily and still has back and forth constipation/diarrhea. Pt states she also sees cardio for heart rate being 160's to 40's and having episodes where she was collasping, was never  " out" but her legs would give out and has been diagnosed with proximal A Fib. She just got off a heart monitor and is waiting to hear from her cardiologist about results. Pt states she leaves for [place] 12-31 until the end of March, she states she feels well, her BP is under control and her heart seems to be fine at this time and she feels like she can have this procedure with no issues. She wants to have this done before her [place] trip if possible. I explained to her with all of this new medical history Dr Juanda ChanceBrodie may want an Office Visit to discuss all this. Will TE Dr Juanda ChanceBrodie and get back with pt at [phone]. I did complete her PV and told her I would call her with response.     [name] [name] PV

## 2014-08-05 NOTE — Telephone Encounter (Signed)
I received this in error. thanks

## 2014-08-05 NOTE — Telephone Encounter (Signed)
I see when she is on the schedule for Dec 30. It is OK

## 2014-08-05 NOTE — Progress Notes (Signed)
Pt has no egg or soy allergy. ewm No home 02 use. ewm No diet pills, no blood thinners. ewm No issues with past sedation. ewm Pt states she had a colonoscopy in 2011 in [place] and the one before this was in 2003 with Dr Juanda ChanceBrodie. Pt states she has had multiple health issues in the past year. She states her BP was elevated to 200/125 and that is now under control with meds, she has had significant changes in her bowels. One day constipated, one day diarrhea and the size of her stools have changed and are like the size of chalk and this is new. No blood in stools. Stool varies. isnt always the same size. Her mother had a large polyp removed, the size of a tennis ball and had to also have inches of her colon removed with that polyp per pt but was benign. We have sent for her 2011 colon to be faxed to us at fax number [phone]. Pt states that with these changes she has started taking miralax daily and still has back and forth constipation/diarrhea. Pt states she also sees cardio for heart rate being 160's to 40's and having episodes where she was collasping, was never  " out" but her legs would give out and has been diagnosed with proximal A Fib. She just got off a heart monitor and is waiting to hear from her cardiologist about results. Pt states she leaves for [place] 12-31 until the end of March, she states she feels well, her BP is under control and her heart seems to be fine at this time and she feels like she can have this procedure with no issues. She wants to have this done before her [place] trip if possible.. I explained to her with all of this new medical history Dr Juanda ChanceBrodie may want an Office Visit to discuss all this. Will TE Dr Juanda ChanceBrodie and get back with pt at [phone].  Hilda LiasMarie PV

## 2014-08-05 NOTE — Telephone Encounter (Signed)
Pt called and informed to proceed as scheduled per Dr Juanda ChanceBrodie. Pt asked to call and give results of her heart monitor once she receives these from her cardiologist.  Pt verbalized understanding of this and states if other issues arise, she will call.  Hilda LiasMarie PV

## 2014-08-19 ENCOUNTER — Ambulatory Visit (AMBULATORY_SURGERY_CENTER): Payer: Managed Care, Other (non HMO) | Admitting: Internal Medicine

## 2014-08-19 ENCOUNTER — Encounter: Payer: Self-pay | Admitting: Internal Medicine

## 2014-08-19 VITALS — BP 129/84 | HR 67 | Temp 98.0°F | Resp 14 | Ht 69.0 in | Wt 155.0 lb

## 2014-08-19 DIAGNOSIS — Z1211 Encounter for screening for malignant neoplasm of colon: Secondary | ICD-10-CM

## 2014-08-19 DIAGNOSIS — K589 Irritable bowel syndrome without diarrhea: Secondary | ICD-10-CM

## 2014-08-19 DIAGNOSIS — R194 Change in bowel habit: Secondary | ICD-10-CM

## 2014-08-19 MED ORDER — SODIUM CHLORIDE 0.9 % IV SOLN: 500.0000 mL | INTRAVENOUS | Status: DC

## 2014-08-19 NOTE — Patient Instructions (Signed)

## 2014-08-19 NOTE — Op Note (Signed)
[facility] [address] [place], [id number]   COLONOSCOPY PROCEDURE REPORT  PATIENT: [name], [name] [name]  MR#: [id number] BIRTHDATE: 1951-01-16 , 63  yrs. old GENDER: female ENDOSCOPIST: Hart Carwinora M Alene Bergerson, MD REFERRED BY:Dr Candie EchevariaJohn Moore  ,[place] PROCEDURE DATE:  08/19/2014 PROCEDURE:   Colonoscopy, screening First Screening Colonoscopy - Avg.  risk and is 50 yrs.  old or older - No.  Prior Negative Screening - Now for repeat screening. 10 or more years since last screening  History of Adenoma - Now for follow-up colonoscopy & has been > or = to 3 yrs.  N/A  Polyps Removed Today? No.  Polyps Removed Today? No.  Recommend repeat exam, <10 yrs? Polyps Removed Today? No.  Recommend repeat exam, <10 yrs? No. ASA CLASS:   Class II INDICATIONS:last colonoscopy 2003 was normal.  Patient reports recent colonoscopy several years ago in [place],.  [place],no records available, she is having change in bowel habits, she has a history of irritable bowel syndrome. MEDICATIONS: Monitored anesthesia care and Propofol 200 mg IV  DESCRIPTION OF PROCEDURE:   After the risks benefits and alternatives of the procedure were thoroughly explained, informed consent was obtained.  The digital rectal exam revealed no abnormalities of the rectum.   The LB [id number]-[id number]  endoscope was introduced through the anus and advanced to the cecum, which was identified by both the appendix and ileocecal valve. No adverse events experienced.   The quality of the prep was good, using MoviPrep  The instrument was then slowly withdrawn as the colon was fully examined.      COLON FINDINGS: A normal appearing cecum, ileocecal valve, and appendiceal orifice were identified.  The ascending, transverse, descending, sigmoid colon, and rectum appeared unremarkable. Retroflexed views revealed no abnormalities. The time to cecum=8 minutes 16 seconds.  Withdrawal time=6 minutes 27 seconds.  The scope was  withdrawn and the procedure completed. COMPLICATIONS: There were no immediate complications.  ENDOSCOPIC IMPRESSION: Normal colonoscopy  RECOMMENDATIONS:  High-fiber diet Trial of fiber supplements Benefiber one to 2 teaspoons daily Drink plenty of fluids Recall colonoscopy in 10 years  eSigned:  Hart Carwinora M Dryden Tapley, MD 08/19/2014 3:14 PM   cc:

## 2014-08-19 NOTE — Progress Notes (Signed)
No problems noted in the recovery room. maw 

## 2014-08-19 NOTE — Progress Notes (Signed)
Report to PACU, RN, vss, BBS= Clear.  

## 2014-08-24 ENCOUNTER — Telehealth: Payer: Self-pay

## 2014-08-24 NOTE — Telephone Encounter (Signed)
  Follow up Call-  Call back number 08/19/2014  Post procedure Call Back phone  # [phone]  Permission to leave phone message Yes     Patient questions:  Do you have a fever, pain , or abdominal swelling? No. Pain Score  0 *  Have you tolerated food without any problems? Yes.    Have you been able to return to your normal activities? Yes.    Do you have any questions about your discharge instructions: Diet   No. Medications  No. Follow up visit  No.  Do you have questions or concerns about your Care? No.  Actions: * If pain score is 4 or above: No action needed, pain <4.
